# Patient Record
Sex: Male | Born: 1965 | Race: Black or African American | Hispanic: No | Marital: Married | Smoking: Smoker, current status unknown
Health system: Southern US, Community
[De-identification: ages and names within clinical notes are randomized; demographics above are authoritative.]

## PROBLEM LIST (undated history)

## (undated) HISTORY — PX: OTHER SURGICAL HISTORY: SHX169

---

## 2006-11-19 ENCOUNTER — Emergency Department: Payer: Self-pay | Admitting: Emergency Medicine

## 2009-10-12 ENCOUNTER — Emergency Department: Payer: Self-pay | Admitting: Unknown Physician Specialty

## 2010-10-17 ENCOUNTER — Emergency Department: Payer: Self-pay | Admitting: Emergency Medicine

## 2010-10-20 ENCOUNTER — Emergency Department: Payer: Self-pay | Admitting: Emergency Medicine

## 2010-11-08 ENCOUNTER — Inpatient Hospital Stay: Payer: Self-pay | Admitting: Internal Medicine

## 2018-05-03 ENCOUNTER — Other Ambulatory Visit: Payer: Self-pay

## 2018-05-03 ENCOUNTER — Emergency Department (HOSPITAL_COMMUNITY)
Admission: EM | Admit: 2018-05-03 | Discharge: 2018-05-03 | Disposition: A | Discharge status: Home / Self Care | Payer: No Typology Code available for payment source | Attending: Emergency Medicine | Admitting: Emergency Medicine

## 2018-05-03 ENCOUNTER — Emergency Department (HOSPITAL_COMMUNITY): Payer: No Typology Code available for payment source

## 2018-05-03 ENCOUNTER — Encounter (HOSPITAL_COMMUNITY): Payer: Self-pay | Admitting: Emergency Medicine

## 2018-05-03 DIAGNOSIS — Z91010 Allergy to peanuts: Secondary | ICD-10-CM | POA: Insufficient documentation

## 2018-05-03 DIAGNOSIS — Y92410 Unspecified street and highway as the place of occurrence of the external cause: Secondary | ICD-10-CM | POA: Insufficient documentation

## 2018-05-03 DIAGNOSIS — Y998 Other external cause status: Secondary | ICD-10-CM | POA: Insufficient documentation

## 2018-05-03 DIAGNOSIS — S39012A Strain of muscle, fascia and tendon of lower back, initial encounter: Secondary | ICD-10-CM | POA: Insufficient documentation

## 2018-05-03 DIAGNOSIS — Y9389 Activity, other specified: Secondary | ICD-10-CM | POA: Diagnosis not present

## 2018-05-03 DIAGNOSIS — S161XXA Strain of muscle, fascia and tendon at neck level, initial encounter: Secondary | ICD-10-CM

## 2018-05-03 DIAGNOSIS — S3992XA Unspecified injury of lower back, initial encounter: Secondary | ICD-10-CM | POA: Diagnosis present

## 2018-05-03 MED ORDER — IBUPROFEN 800 MG PO TABS
800.0000 mg | ORAL_TABLET | Freq: Once | ORAL | Status: AC
  Administered 2018-05-03: 800 mg via ORAL
  Filled 2018-05-03: qty 1

## 2018-05-03 MED ORDER — CYCLOBENZAPRINE HCL 10 MG PO TABS
10.0000 mg | ORAL_TABLET | Freq: Once | ORAL | Status: AC
  Administered 2018-05-03: 10 mg via ORAL
  Filled 2018-05-03: qty 1

## 2018-05-03 MED ORDER — CYCLOBENZAPRINE HCL 10 MG PO TABS: 10.0000 mg | ORAL_TABLET | Freq: Three times a day (TID) | ORAL | 0 refills | Status: DC | PRN

## 2018-05-03 MED ORDER — IBUPROFEN 600 MG PO TABS: 600.0000 mg | ORAL_TABLET | Freq: Four times a day (QID) | ORAL | 0 refills | Status: DC | PRN
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
## 2018-05-03 NOTE — Discharge Instructions (Addendum)
Apply ice packs on and off to your neck.  Take the medication as directed if needed for pain or muscle spasms.  Follow-up with your primary provider for recheck in a few days if your symptoms are not improving.  Return the ER for any worsening symptoms.

## 2018-06-21 ENCOUNTER — Encounter (HOSPITAL_COMMUNITY): Payer: Self-pay | Admitting: Emergency Medicine

## 2018-06-21 ENCOUNTER — Emergency Department (HOSPITAL_COMMUNITY)
Admission: EM | Admit: 2018-06-21 | Discharge: 2018-06-21 | Disposition: A | Discharge status: Home / Self Care | Payer: Commercial Managed Care - PPO | Attending: Emergency Medicine | Admitting: Emergency Medicine

## 2018-06-21 ENCOUNTER — Emergency Department (HOSPITAL_COMMUNITY): Payer: Commercial Managed Care - PPO

## 2018-06-21 ENCOUNTER — Other Ambulatory Visit: Payer: Self-pay

## 2018-06-21 DIAGNOSIS — S161XXA Strain of muscle, fascia and tendon at neck level, initial encounter: Secondary | ICD-10-CM | POA: Diagnosis not present

## 2018-06-21 DIAGNOSIS — Y92410 Unspecified street and highway as the place of occurrence of the external cause: Secondary | ICD-10-CM | POA: Diagnosis not present

## 2018-06-21 DIAGNOSIS — Y999 Unspecified external cause status: Secondary | ICD-10-CM | POA: Diagnosis not present

## 2018-06-21 DIAGNOSIS — Y9389 Activity, other specified: Secondary | ICD-10-CM | POA: Diagnosis not present

## 2018-06-21 DIAGNOSIS — S199XXA Unspecified injury of neck, initial encounter: Secondary | ICD-10-CM | POA: Diagnosis present

## 2018-06-21 MED ORDER — CYCLOBENZAPRINE HCL 5 MG PO TABS: 5.0000 mg | ORAL_TABLET | Freq: Three times a day (TID) | ORAL | 0 refills | Status: DC | PRN

## 2018-06-21 MED ORDER — IBUPROFEN 600 MG PO TABS: 600.0000 mg | ORAL_TABLET | Freq: Four times a day (QID) | ORAL | 0 refills | Status: DC | PRN
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
## 2018-12-17 ENCOUNTER — Emergency Department (HOSPITAL_COMMUNITY)
Admission: EM | Admit: 2018-12-17 | Discharge: 2018-12-17 | Disposition: A | Discharge status: Home / Self Care | Payer: Commercial Managed Care - PPO | Attending: Emergency Medicine | Admitting: Emergency Medicine

## 2018-12-17 ENCOUNTER — Other Ambulatory Visit: Payer: Self-pay

## 2018-12-17 ENCOUNTER — Encounter (HOSPITAL_COMMUNITY): Payer: Self-pay

## 2018-12-17 DIAGNOSIS — Y929 Unspecified place or not applicable: Secondary | ICD-10-CM | POA: Insufficient documentation

## 2018-12-17 DIAGNOSIS — N4822 Cellulitis of corpus cavernosum and penis: Secondary | ICD-10-CM

## 2018-12-17 DIAGNOSIS — Y9389 Activity, other specified: Secondary | ICD-10-CM | POA: Insufficient documentation

## 2018-12-17 DIAGNOSIS — W57XXXA Bitten or stung by nonvenomous insect and other nonvenomous arthropods, initial encounter: Secondary | ICD-10-CM | POA: Insufficient documentation

## 2018-12-17 DIAGNOSIS — Z91010 Allergy to peanuts: Secondary | ICD-10-CM | POA: Insufficient documentation

## 2018-12-17 DIAGNOSIS — Y999 Unspecified external cause status: Secondary | ICD-10-CM | POA: Insufficient documentation

## 2018-12-17 LAB — CBC WITH DIFFERENTIAL/PLATELET
Abs Immature Granulocytes: 0.02 10*3/uL (ref 0.00–0.07)
Basophils Absolute: 0 10*3/uL (ref 0.0–0.1)
Basophils Relative: 0 %
Eosinophils Absolute: 0.1 10*3/uL (ref 0.0–0.5)
Eosinophils Relative: 2 %
HCT: 41.3 % (ref 39.0–52.0)
Hemoglobin: 13.5 g/dL (ref 13.0–17.0)
Immature Granulocytes: 0 %
Lymphocytes Relative: 26 %
Lymphs Abs: 1.6 10*3/uL (ref 0.7–4.0)
MCH: 29 pg (ref 26.0–34.0)
MCHC: 32.7 g/dL (ref 30.0–36.0)
MCV: 88.6 fL (ref 80.0–100.0)
Monocytes Absolute: 0.4 10*3/uL (ref 0.1–1.0)
Monocytes Relative: 6 %
Neutro Abs: 3.8 10*3/uL (ref 1.7–7.7)
Neutrophils Relative %: 66 %
Platelets: 236 10*3/uL (ref 150–400)
RBC: 4.66 MIL/uL (ref 4.22–5.81)
RDW: 14.4 % (ref 11.5–15.5)
WBC: 5.9 10*3/uL (ref 4.0–10.5)
nRBC: 0 % (ref 0.0–0.2)

## 2018-12-17 LAB — BASIC METABOLIC PANEL
Anion gap: 5 (ref 5–15)
BUN: 18 mg/dL (ref 6–20)
CO2: 26 mmol/L (ref 22–32)
Calcium: 8.7 mg/dL — ABNORMAL LOW (ref 8.9–10.3)
Chloride: 111 mmol/L (ref 98–111)
Creatinine, Ser: 1.34 mg/dL — ABNORMAL HIGH (ref 0.61–1.24)
GFR calc Af Amer: >60 mL/min (ref >60)
GFR calc non Af Amer: >60 mL/min (ref >60)
Glucose, Bld: 96 mg/dL (ref 70–99)
Potassium: 4.2 mmol/L (ref 3.5–5.1)
Sodium: 142 mmol/L (ref 135–145)

## 2018-12-17 MED ORDER — DOXYCYCLINE HYCLATE 100 MG PO CAPS: 100.0000 mg | ORAL_CAPSULE | Freq: Two times a day (BID) | ORAL | 0 refills | Status: DC

## 2018-12-17 MED ORDER — HYDROCODONE-ACETAMINOPHEN 5-325 MG PO TABS: 2.0000 | ORAL_TABLET | ORAL | 0 refills | Status: DC | PRN

## 2018-12-17 MED ORDER — DOXYCYCLINE HYCLATE 100 MG PO TABS
100.0000 mg | ORAL_TABLET | Freq: Once | ORAL | Status: AC
  Administered 2018-12-17: 100 mg via ORAL
  Filled 2018-12-17: qty 1
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
## 2018-12-17 NOTE — Discharge Instructions (Signed)
Discharging you with antibiotics and pain medication.  Please return to the emergency department immediately if you developed chills, fever, worsening or severe pain, inability to urinate.  Otherwise please follow-up in the next 3 to 5 days with a urologist for reevaluation.

## 2018-12-17 NOTE — ED Triage Notes (Signed)
Pt reports pulling tick off of penis on Saturday. Reports swelling and itching

## 2018-12-17 NOTE — ED Provider Notes (Signed)
Patient placed in Quick Look pathway, seen and evaluated   Chief Complaint: Tick bite  HPI:   Patient bitten by a tick on Saturday on his penis He pulled the tick off and thought he removed  The entire tick out he has had increased pain and swelling now markedly larger and more tender. No hx of dm. No testicle pain.    ROS: Penis swellin (one)  Physical Exam:   Gen: No distress- appears uncomfortable  Neuro: Awake and Alert  Skin: Warm    Focused Exam: Markedly enlarged penis with generalized edema and swelling of the Foreskin extends over the glans- uncircumcised.   Initiation of care has begun. The patient has been counseled on the process, plan, and necessity for staying for the completion/evaluation, and the remainder of the medical screening examination    Margarita Mail, Hershal Coria 12/17/18 1714    Noemi Chapel, MD 12/22/18 [phone]

## 2018-12-18 MED FILL — Hydrocodone-Acetaminophen Tab 5-325 MG: ORAL | Qty: 6 | Status: AC

## 2019-03-16 ENCOUNTER — Emergency Department (HOSPITAL_COMMUNITY)
Admission: EM | Admit: 2019-03-16 | Discharge: 2019-03-16 | Disposition: A | Discharge status: Home / Self Care | Payer: Self-pay | Attending: Emergency Medicine | Admitting: Emergency Medicine

## 2019-03-16 ENCOUNTER — Encounter (HOSPITAL_COMMUNITY): Payer: Self-pay

## 2019-03-16 ENCOUNTER — Other Ambulatory Visit: Payer: Self-pay

## 2019-03-16 DIAGNOSIS — T7840XA Allergy, unspecified, initial encounter: Secondary | ICD-10-CM | POA: Insufficient documentation

## 2019-03-16 DIAGNOSIS — Z91010 Allergy to peanuts: Secondary | ICD-10-CM | POA: Insufficient documentation

## 2019-03-16 DIAGNOSIS — L509 Urticaria, unspecified: Secondary | ICD-10-CM

## 2019-03-16 DIAGNOSIS — F17200 Nicotine dependence, unspecified, uncomplicated: Secondary | ICD-10-CM | POA: Insufficient documentation

## 2019-03-16 MED ORDER — FAMOTIDINE IN NACL 20-0.9 MG/50ML-% IV SOLN
20.0000 mg | Freq: Once | INTRAVENOUS | Status: AC
  Administered 2019-03-16: 20 mg via INTRAVENOUS
  Filled 2019-03-16: qty 50

## 2019-03-16 MED ORDER — FAMOTIDINE 20 MG PO TABS: 20.0000 mg | ORAL_TABLET | Freq: Two times a day (BID) | ORAL | 0 refills | Status: DC

## 2019-03-16 MED ORDER — PREDNISONE 20 MG PO TABS: ORAL_TABLET | ORAL | 0 refills | Status: DC

## 2019-03-16 MED ORDER — DIPHENHYDRAMINE HCL 50 MG/ML IJ SOLN
25.0000 mg | Freq: Once | INTRAMUSCULAR | Status: AC
  Administered 2019-03-16: 25 mg via INTRAVENOUS
  Filled 2019-03-16: qty 1

## 2019-03-16 MED ORDER — METHYLPREDNISOLONE SODIUM SUCC 125 MG IJ SOLR
125.0000 mg | Freq: Once | INTRAMUSCULAR | Status: AC
  Administered 2019-03-16: 125 mg via INTRAVENOUS
  Filled 2019-03-16: qty 2

## 2019-03-16 NOTE — Discharge Instructions (Addendum)
Stay cool, getting hot such as working outside in the heat or taking a hot shower may make you start to itch or get the rash again.

## 2019-03-16 NOTE — ED Triage Notes (Signed)
Pt with hives and itching all over onset approx 1 hour ago, has taken one benadryl 10 mins ago.  Pt denies new meds or foods.

## 2019-03-16 NOTE — ED Provider Notes (Signed)
[facility] EMERGENCY DEPARTMENT Provider Note   CSN: [id number] Arrival date & time: 03/16/19  0202   Time seen 2:49 AM  History   Chief Complaint Chief Complaint  Patient presents with  . Allergic Reaction    HPI Francis Bennett is a 53 y.o. male.     HPI patient states about 10 PM his wife brought him a hamburger from [employer] that he is never eaten before.  He states about 1 AM he started having itching all over and then broke out in a rash.  He denies any swelling of his lips, tongue, throat, or having any difficulty breathing.  He states he is never had anything like this happen before.  He denies being on any new medications.  Nothing else was different today.  Patient took 25 mg of Benadryl prior to coming to the ED.  PCP Ranae Plumber, PA   History reviewed. No pertinent past medical history.  There are no active problems to display for this patient.   Past Surgical History:  Procedure Laterality Date  . skull surgery          Home Medications    Prior to Admission medications   Medication Sig Start Date End Date Taking? Authorizing Provider  cyclobenzaprine (FLEXERIL) 5 MG tablet Take 1 tablet (5 mg total) by mouth 3 (three) times daily as needed for muscle spasms. 06/21/18   Evalee Jefferson, PA-C  doxycycline (VIBRAMYCIN) 100 MG capsule Take 1 capsule (100 mg total) by mouth 2 (two) times daily. 12/17/18   Harris, Vernie Shanks, PA-C  famotidine (PEPCID) 20 MG tablet Take 1 tablet (20 mg total) by mouth 2 (two) times daily. 03/16/19   Rolland Porter, MD  HYDROcodone-acetaminophen (NORCO/VICODIN) 5-325 MG tablet Take 2 tablets by mouth every 4 (four) hours as needed for moderate pain. 12/17/18   Noemi Chapel, MD  ibuprofen (ADVIL,MOTRIN) 600 MG tablet Take 1 tablet (600 mg total) by mouth every 6 (six) hours as needed. 06/21/18   Evalee Jefferson, PA-C  predniSONE (DELTASONE) 20 MG tablet Take 3 po QD x 3d , then 2 po QD x 3d then 1 po QD x 3d 03/16/19   Rolland Porter, MD    Family  History History reviewed. No pertinent family history.  Social History Social History   Tobacco Use  . Smoking status: Smoker, Current Status Unknown  . Smokeless tobacco: Never Used  Substance Use Topics  . Alcohol use: Yes    Comment: occasionally  . Drug use: Not Currently  lives at home Lives with spouse   Allergies   Peanut-containing drug products   Review of Systems Review of Systems  All other systems reviewed and are negative.    Physical Exam Updated Vital Signs BP 124/79   Pulse 91   Temp 98.3 F (36.8 C) (Oral)   Resp 20   Ht 5\' 7"  (1.702 m)   Wt 102.1 kg   SpO2 95%   BMI 35.24 kg/m   Physical Exam Vitals signs and nursing note reviewed.  Constitutional:      General: He is not in acute distress.    Appearance: Normal appearance. He is well-developed. He is not ill-appearing or toxic-appearing.  HENT:     Head: Normocephalic and atraumatic.     Right Ear: External ear normal.     Left Ear: External ear normal.     Nose: Nose normal. No mucosal edema or rhinorrhea.     Mouth/Throat:     Mouth: Mucous membranes are  dry.     Dentition: No dental abscesses.     Pharynx: Oropharynx is clear. No uvula swelling.     Comments: No swelling of lips, tongue, or oropharynx seen Eyes:     Extraocular Movements: Extraocular movements intact.     Conjunctiva/sclera: Conjunctivae normal.     Pupils: Pupils are equal, round, and reactive to light.  Neck:     Musculoskeletal: Full passive range of motion without pain, normal range of motion and neck supple.  Cardiovascular:     Rate and Rhythm: Normal rate and regular rhythm.     Heart sounds: Normal heart sounds. No murmur. No friction rub. No gallop.   Pulmonary:     Effort: Pulmonary effort is normal. No respiratory distress.     Breath sounds: Normal breath sounds. No wheezing, rhonchi or rales.  Chest:     Chest wall: No tenderness or crepitus.  Musculoskeletal: Normal range of motion.         General: No tenderness.     Comments: Moves all extremities well.   Skin:    General: Skin is warm and dry.     Coloration: Skin is not pale.     Findings: Rash present. No erythema.     Comments: Patient has diffuse small urticarial lesions some coalescing over most of his trunk and extremities.  Neurological:     Mental Status: He is alert and oriented to person, place, and time.     Cranial Nerves: No cranial nerve deficit.  Psychiatric:        Mood and Affect: Mood is not anxious.        Speech: Speech normal.        Behavior: Behavior normal.      ED Treatments / Results  Labs (all labs ordered are listed, but only abnormal results are displayed) Labs Reviewed - No data to display  EKG None  Radiology No results found.  Procedures Procedures (including critical care time)  Medications Ordered in ED Medications  diphenhydrAMINE (BENADRYL) injection 25 mg (25 mg Intravenous Given 03/16/19 0232)  famotidine (PEPCID) IVPB 20 mg premix (0 mg Intravenous Stopped 03/16/19 0326)  methylPREDNISolone sodium succinate (SOLU-MEDROL) 125 mg/2 mL injection 125 mg (125 mg Intravenous Given 03/16/19 0232)     Initial Impression / Assessment and Plan / ED Course  I have reviewed the triage vital signs and the nursing notes.  Pertinent labs & imaging results that were available during my care of the patient were reviewed by me and considered in my medical decision making (see chart for details).      Patient had already taken 25 mg of Benadryl at home, he was given Benadryl 25 mg IV, Solu-Medrol 125 mg IV and Pepcid 20 mg IV.  Recheck at 5 AM patient is easily awakened, his rash is gone, he states his itching is gone.  His wife is here and is aware of what type of sandwich she had bought him which was something new at [employer].  She also states he ate a cupcake from a birthday party today.  He was advised to avoid both of these things in the future and he is agreeable.  Final  Clinical Impressions(s) / ED Diagnoses   Final diagnoses:  Allergic reaction, initial encounter  Urticaria    ED Discharge Orders         Ordered    famotidine (PEPCID) 20 MG tablet  2 times daily     03/16/19 0530  predniSONE (DELTASONE) 20 MG tablet     03/16/19 0530          Plan discharge  Devoria AlbeIva Mesiah Manzo, MD, Concha PyoFACEP    Lillieanna Tuohy, MD 03/16/19 [phone]

## 2019-08-13 ENCOUNTER — Ambulatory Visit: Payer: Self-pay | Attending: Internal Medicine

## 2019-08-13 ENCOUNTER — Other Ambulatory Visit: Payer: Self-pay

## 2019-08-13 DIAGNOSIS — Z20822 Contact with and (suspected) exposure to covid-19: Secondary | ICD-10-CM | POA: Insufficient documentation

## 2019-08-14 LAB — NOVEL CORONAVIRUS, NAA: SARS-CoV-2, NAA: NOT DETECTED

## 2019-08-16 ENCOUNTER — Telehealth: Payer: Self-pay

## 2019-08-16 NOTE — Telephone Encounter (Signed)
Pt. Given COVID 19 results, verbalizes understanding. 

## 2019-09-26 ENCOUNTER — Ambulatory Visit: Payer: Self-pay | Attending: Internal Medicine

## 2019-09-26 DIAGNOSIS — Z23 Encounter for immunization: Secondary | ICD-10-CM

## 2019-09-26 NOTE — Progress Notes (Signed)
   Covid-19 Vaccination Clinic  Name:  MARICUS TANZI    MRN: [id number] DOB: 11-Sep-1965  09/26/2019  Mr. Barsky was observed post Covid-19 immunization for 30 minutes based on pre-vaccination screening without incident. He was provided with Vaccine Information Sheet and instruction to access the V-Safe system.   Mr. Kluever was instructed to call 911 with any severe reactions post vaccine: Marland Kitchen Difficulty breathing  . Swelling of face and throat  . A fast heartbeat  . A bad rash all over body  . Dizziness and weakness   Immunizations Administered    Name Date Dose VIS Date Route   Moderna COVID-19 Vaccine 09/26/2019  1:41 PM 0.5 mL 06/04/2019 Intramuscular   Manufacturer: Moderna   Lot: [id number]   NDC: 71580-638-68

## 2019-10-24 ENCOUNTER — Ambulatory Visit: Payer: Self-pay | Attending: Internal Medicine

## 2019-10-24 DIAGNOSIS — Z23 Encounter for immunization: Secondary | ICD-10-CM

## 2019-10-24 NOTE — Progress Notes (Signed)
   Covid-19 Vaccination Clinic  Name:  CODI KERTZ    MRN: [id number] DOB: 1965/08/03  10/24/2019  Mr. Matty was observed post Covid-19 immunization for 15 minutes without incident. He was provided with Vaccine Information Sheet and instruction to access the V-Safe system.   Mr. Senn was instructed to call 911 with any severe reactions post vaccine: Marland Kitchen Difficulty breathing  . Swelling of face and throat  . A fast heartbeat  . A bad rash all over body  . Dizziness and weakness   Immunizations Administered    Name Date Dose VIS Date Route   Moderna COVID-19 Vaccine 10/24/2019  1:47 PM 0.5 mL 06/2019 Intramuscular   Manufacturer: Moderna   Lot: [id number]   NDC: 92780-044-71
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
## 2024-07-09 ENCOUNTER — Other Ambulatory Visit: Payer: Self-pay

## 2024-07-09 ENCOUNTER — Encounter (HOSPITAL_COMMUNITY): Payer: Self-pay

## 2024-07-09 ENCOUNTER — Emergency Department (HOSPITAL_COMMUNITY)
Admission: EM | Admit: 2024-07-09 | Discharge: 2024-07-10 | Disposition: A | Discharge status: Home / Self Care | Payer: MEDICAID | Attending: Emergency Medicine | Admitting: Emergency Medicine

## 2024-07-09 DIAGNOSIS — Z91010 Allergy to peanuts: Secondary | ICD-10-CM | POA: Diagnosis not present

## 2024-07-09 DIAGNOSIS — R059 Cough, unspecified: Secondary | ICD-10-CM | POA: Diagnosis present

## 2024-07-09 DIAGNOSIS — J40 Bronchitis, not specified as acute or chronic: Secondary | ICD-10-CM | POA: Insufficient documentation

## 2024-07-09 NOTE — ED Triage Notes (Signed)
 Pt reports dry cough x 1-2 weeks, chest pain started last night, pt also reports runny nose, watery eyes and being tired, not able to sleep

## 2024-07-10 ENCOUNTER — Emergency Department (HOSPITAL_COMMUNITY): Payer: MEDICAID

## 2024-07-10 LAB — BASIC METABOLIC PANEL WITH GFR
Anion gap: 8 (ref 5–15)
BUN: 18 mg/dL (ref 6–20)
CO2: 27 mmol/L (ref 22–32)
Calcium: 8.2 mg/dL — ABNORMAL LOW (ref 8.9–10.3)
Chloride: 105 mmol/L (ref 98–111)
Creatinine, Ser: 1.07 mg/dL (ref 0.61–1.24)
GFR, Estimated: >60 mL/min
Glucose, Bld: 89 mg/dL (ref 70–99)
Potassium: 4.1 mmol/L (ref 3.5–5.1)
Sodium: 140 mmol/L (ref 135–145)

## 2024-07-10 LAB — CBC WITH DIFFERENTIAL/PLATELET
Abs Immature Granulocytes: 0.01 K/uL (ref 0.00–0.07)
Basophils Absolute: 0 K/uL (ref 0.0–0.1)
Basophils Relative: 0 %
Eosinophils Absolute: 0 K/uL (ref 0.0–0.5)
Eosinophils Relative: 1 %
HCT: 35.9 % — ABNORMAL LOW (ref 39.0–52.0)
Hemoglobin: 11.7 g/dL — ABNORMAL LOW (ref 13.0–17.0)
Immature Granulocytes: 0 %
Lymphocytes Relative: 19 %
Lymphs Abs: 0.7 K/uL (ref 0.7–4.0)
MCH: 29.3 pg (ref 26.0–34.0)
MCHC: 32.6 g/dL (ref 30.0–36.0)
MCV: 90 fL (ref 80.0–100.0)
Monocytes Absolute: 0.2 K/uL (ref 0.1–1.0)
Monocytes Relative: 6 %
Neutro Abs: 2.6 K/uL (ref 1.7–7.7)
Neutrophils Relative %: 74 %
Platelets: 211 K/uL (ref 150–400)
RBC: 3.99 MIL/uL — ABNORMAL LOW (ref 4.22–5.81)
RDW: 14.9 % (ref 11.5–15.5)
WBC: 3.5 K/uL — ABNORMAL LOW (ref 4.0–10.5)
nRBC: 0 % (ref 0.0–0.2)

## 2024-07-10 LAB — TROPONIN T, HIGH SENSITIVITY: Troponin T High Sensitivity: <15 ng/L (ref 0–19)

## 2024-07-10 MED ORDER — PREDNISONE 50 MG PO TABS
60.0000 mg | ORAL_TABLET | Freq: Once | ORAL | Status: AC
  Administered 2024-07-10: 60 mg via ORAL
  Filled 2024-07-10: qty 1

## 2024-07-10 MED ORDER — PREDNISONE 10 MG (21) PO TBPK: ORAL_TABLET | ORAL | 0 refills | Status: DC

## 2024-07-10 MED ORDER — ALBUTEROL SULFATE HFA 108 (90 BASE) MCG/ACT IN AERS
2.0000 | INHALATION_SPRAY | RESPIRATORY_TRACT | Status: DC | PRN
  Administered 2024-07-10: 2 via RESPIRATORY_TRACT
  Filled 2024-07-10: qty 6.7

## 2024-07-10 NOTE — ED Notes (Signed)
 RT called to give MDI with instructions on use.

## 2024-07-10 NOTE — ED Provider Notes (Signed)
 "  [place] EMERGENCY DEPARTMENT AT [facility]  Provider Note  CSN: [id number] Arrival date & time: 07/09/24 2329  History Chief Complaint  Patient presents with   Cough   Chest Pain    Francis Bennett is a 59 y.o. male with no significant PMH reports he has had a dry cough for the last week or two, worsened in the last couple days, particularly at night. No fevers. He does smoke occasionally. The coughing has made his chest sore.    Home Medications Prior to Admission medications  Medication Sig Start Date End Date Taking? Authorizing Provider  predniSONE  (STERAPRED UNI-PAK 21 TAB) 10 MG (21) TBPK tablet 10mg  Tabs, 6 day taper. Use as directed 07/10/24  Yes Roselyn Carlin NOVAK, MD     Allergies    Peanut-containing drug products   Review of Systems   Review of Systems Please see HPI for pertinent positives and negatives  Physical Exam BP (!) 145/85   Pulse 88   Temp 98.9 F (37.2 C) (Oral)   Resp 20   Ht 5' 8 (1.727 m)   Wt 89.8 kg   SpO2 99%   BMI 30.11 kg/m   Physical Exam Vitals and nursing note reviewed.  Constitutional:      Appearance: Normal appearance.  HENT:     Head: Normocephalic and atraumatic.     Nose: Nose normal.     Mouth/Throat:     Mouth: Mucous membranes are moist.  Eyes:     Extraocular Movements: Extraocular movements intact.     Conjunctiva/sclera: Conjunctivae normal.  Cardiovascular:     Rate and Rhythm: Normal rate.  Pulmonary:     Effort: Pulmonary effort is normal.     Breath sounds: Normal breath sounds.     Comments: Cry cough Abdominal:     General: Abdomen is flat.     Palpations: Abdomen is soft.     Tenderness: There is no abdominal tenderness.  Musculoskeletal:        General: No swelling. Normal range of motion.     Cervical back: Neck supple.  Skin:    General: Skin is warm and dry.  Neurological:     General: No focal deficit present.     Mental Status: He is alert.  Psychiatric:        Mood and  Affect: Mood normal.     ED Results / Procedures / Treatments   EKG EKG Interpretation Date/Time:  Tuesday July 09 2024 23:44:14 EST Ventricular Rate:  84 PR Interval:  152 QRS Duration:  96 QT Interval:  358 QTC Calculation: 424 R Axis:   -59  Text Interpretation: Sinus rhythm Left anterior fascicular block Abnormal R-wave progression, late transition Baseline wander in lead(s) II No old tracing to compare Confirmed by Roselyn Carlin [phone]) on 07/10/2024 2:26:45 AM  Procedures Procedures  Medications Ordered in the ED Medications  albuterol  (VENTOLIN  HFA) 108 (90 Base) MCG/ACT inhaler 2 puff (has no administration in time range)  predniSONE  (DELTASONE ) tablet 60 mg (has no administration in time range)    Initial Impression and Plan  Patient here with dry cough, some chest soreness but no anginal symptoms. He had labs done in triage showing unremarkable CBC, BMP and Trop. I personally viewed the images from radiology studies and agree with radiologist interpretation: CXR is clear. Suspect a viral bronchitis with post-viral cough. Will give inhaler, course of steroids. Recommend tobacco cessation, PCP follow up, RTED for any other concerns.  ED Course       MDM Rules/Calculators/A&P Medical Decision Making Problems Addressed: Bronchitis: acute illness or injury  Amount and/or Complexity of Data Reviewed Labs: ordered. Decision-making details documented in ED Course. Radiology: ordered and independent interpretation performed. Decision-making details documented in ED Course. ECG/medicine tests: ordered and independent interpretation performed. Decision-making details documented in ED Course.  Risk Prescription drug management.     Final Clinical Impression(s) / ED Diagnoses Final diagnoses:  Bronchitis    Rx / DC Orders ED Discharge Orders          Ordered    predniSONE  (STERAPRED UNI-PAK 21 TAB) 10 MG (21) TBPK tablet        07/10/24 0237              Roselyn Carlin NOVAK, MD 07/10/24 [phone]  "

## 2024-07-17 ENCOUNTER — Emergency Department (HOSPITAL_COMMUNITY)
Admission: EM | Admit: 2024-07-17 | Discharge: 2024-07-17 | Disposition: A | Discharge status: Home / Self Care | Payer: MEDICAID | Attending: Emergency Medicine | Admitting: Emergency Medicine

## 2024-07-17 ENCOUNTER — Other Ambulatory Visit: Payer: Self-pay

## 2024-07-17 ENCOUNTER — Emergency Department (HOSPITAL_COMMUNITY): Payer: MEDICAID

## 2024-07-17 ENCOUNTER — Encounter (HOSPITAL_COMMUNITY): Payer: Self-pay

## 2024-07-17 DIAGNOSIS — M160 Bilateral primary osteoarthritis of hip: Secondary | ICD-10-CM | POA: Diagnosis not present

## 2024-07-17 DIAGNOSIS — M25552 Pain in left hip: Secondary | ICD-10-CM | POA: Diagnosis present

## 2024-07-17 MED ORDER — MELOXICAM 15 MG PO TABS: 15.0000 mg | ORAL_TABLET | Freq: Every day | ORAL | 0 refills | Status: DC

## 2024-07-17 MED ORDER — HYDROCODONE-ACETAMINOPHEN 5-325 MG PO TABS
1.0000 | ORAL_TABLET | Freq: Once | ORAL | Status: AC
  Administered 2024-07-17: 1 via ORAL
  Filled 2024-07-17: qty 1

## 2024-07-17 MED ORDER — NAPROXEN 250 MG PO TABS
500.0000 mg | ORAL_TABLET | Freq: Once | ORAL | Status: AC
  Administered 2024-07-17: 500 mg via ORAL
  Filled 2024-07-17: qty 2

## 2024-07-17 NOTE — ED Triage Notes (Signed)
 Pt arrived via [facility] c/o bilateral hip pain that has been on-going for a long time. Pt ambulatory in Triage. Pt reports speaking to a doctor in [facility] in the past and being advised he would need bilateral hip replacement eventually.

## 2024-07-17 NOTE — ED Provider Notes (Signed)
 " [place] EMERGENCY DEPARTMENT AT [facility] Provider Note   CSN: [id number] Arrival date & time: 07/17/24  1125     Patient presents with: Hip Pain   Francis Bennett is a 59 y.o. male.   Patient is a 59 year old male who presents emergency department the chief complaint of bilateral hip pain which has been ongoing for years.  Patient notes that it has become worse over the past few days.  He notes that he has previously seen an orthopedic provider who stated that he may need hip replacements.  He notes that he has had no recent falls or blunt trauma.  He has had no numbness or paresthesias.  He denies any swelling or redness.  There has been no associated abdominal pain, nausea, vomiting, diarrhea.   Hip Pain       Prior to Admission medications  Medication Sig Start Date End Date Taking? Authorizing Provider  predniSONE  (STERAPRED UNI-PAK 21 TAB) 10 MG (21) TBPK tablet 10mg  Tabs, 6 day taper. Use as directed 07/10/24   Roselyn Carlin NOVAK, MD    Allergies: Peanut-containing drug products    Review of Systems  Musculoskeletal:        Bilateral hip pain  All other systems reviewed and are negative.   Updated Vital Signs BP (!) 163/82 (BP Location: Right Arm)   Pulse (!) 59   Temp 98.4 F (36.9 C) (Oral)   Resp 18   Ht 5' 8 (1.727 m)   Wt 89.8 kg   SpO2 100%   BMI 30.11 kg/m   Physical Exam Vitals and nursing note reviewed.  Constitutional:      General: He is not in acute distress.    Appearance: Normal appearance. He is not ill-appearing.  HENT:     Head: Normocephalic and atraumatic.     Nose: Nose normal.     Mouth/Throat:     Mouth: Mucous membranes are moist.  Eyes:     Extraocular Movements: Extraocular movements intact.     Conjunctiva/sclera: Conjunctivae normal.     Pupils: Pupils are equal, round, and reactive to light.  Cardiovascular:     Rate and Rhythm: Normal rate and regular rhythm.     Pulses: Normal pulses.     Heart sounds:  Normal heart sounds. No murmur heard.    No gallop.  Pulmonary:     Effort: Pulmonary effort is normal. No respiratory distress.     Breath sounds: Normal breath sounds. No stridor. No wheezing, rhonchi or rales.  Abdominal:     General: Abdomen is flat. Bowel sounds are normal. There is no distension.     Palpations: Abdomen is soft.     Tenderness: There is no abdominal tenderness. There is no guarding.  Musculoskeletal:        General: Normal range of motion.     Cervical back: Normal range of motion and neck supple.     Comments: Mild tenderness palpation of her bilateral hips, pelvis stable to pelvic compression, nontender palpation of remainder bilateral lower extremities, peripheral pulses 2+, sensation intact distally, gait stable without assistance, full range of motion noted throughout, no obvious deformity or bruising, no skin breakdown or ulceration, no lacerations or abrasions  Skin:    General: Skin is warm and dry.     Findings: No bruising or rash.  Neurological:     General: No focal deficit present.     Mental Status: He is alert and oriented to person, place, and  time. Mental status is at baseline.  Psychiatric:        Mood and Affect: Mood normal.        Behavior: Behavior normal.        Thought Content: Thought content normal.        Judgment: Judgment normal.     (all labs ordered are listed, but only abnormal results are displayed) Labs Reviewed - No data to display  EKG: None  Radiology: No results found.   Procedures   Medications Ordered in the ED  naproxen  (NAPROSYN ) tablet 500 mg (has no administration in time range)  HYDROcodone -acetaminophen  (NORCO/VICODIN) 5-325 MG per tablet 1 tablet (has no administration in time range)                                    Medical Decision Making Patient is doing well at this time and is stable for discharge home.  Discussed with patient that we will treat him with NSAIDs at this time.  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  Do suspect that this is causing his pain.  Do not suspect underlying etiology such as septic joint or gout.  He has no indication for acute arterial insufficiency or DVT at this point.  Discussed with patient the need for close follow-up with orthopedics and resources were provided.  He was directed to call them for an appointment.  Strict turn precautions were provided for any new or worsening symptoms.  Patient voiced understanding and had no additional questions.  Amount and/or Complexity of Data Reviewed Radiology: ordered.  Risk Prescription drug management.        Final diagnoses:  None    ED Discharge Orders     None          Daralene Lonni JONETTA DEVONNA 07/17/24 1300    Melvenia Motto, MD 07/17/24 1502  "

## 2024-07-17 NOTE — Discharge Instructions (Signed)
 Please follow-up closely with orthopedics on an outpatient basis.  Please call to make an appointment with our office.  Return to emergency department immediately for any new or worsening symptoms.

## 2024-07-25 ENCOUNTER — Ambulatory Visit: Payer: MEDICAID | Admitting: Orthopedic Surgery

## 2024-07-25 ENCOUNTER — Encounter: Payer: Self-pay | Admitting: Orthopedic Surgery

## 2024-07-25 VITALS — BP 134/78 | HR 90 | Ht 68.0 in | Wt 198.0 lb

## 2024-07-25 DIAGNOSIS — M160 Bilateral primary osteoarthritis of hip: Secondary | ICD-10-CM

## 2024-07-25 NOTE — Progress Notes (Signed)
 "  Office Visit Note   Patient: Francis Bennett           Date of Birth: 04/11/1966           MRN: [id number] Visit Date: 07/25/2024 Requested by: Donnie Handing, PA [address] [place],  [place] [address] PCP: Donnie Handing, [place]   Assessment & Plan:   Encounter Diagnosis  Name Primary?   Arthritis of both hips Yes    No orders of the defined types were placed in this encounter.   59 year old male with end-stage arthritis in both hips right worse than left.  He is a good candidate for total hip arthroplasty with no medical problems  He is ready to proceed with hip replacement surgery.  I discussed the potential risks of surgery including but not limited to  Dislocation decreased with anterior approach  Blood clot requiring 6 weeks of anticoagulation  Continued pain  Possible need for back surgery  Leg length discrepancy   Subjective: Chief Complaint  Patient presents with   Hip Pain    Bilateral     HPI: Mr. Francis Bennett is 59 years old healthy takes no medications he has noticed progressively worsening pain in both hips right worse than left.  Over the years his deterioration has progressed slowly until recently.  Progression of dysfunction in his activities of daily living has worsened over the last few weeks.  He went to the emergency room.  They started meloxicam  which he does not want to take because he is worried about the side effects.  However he will take 1 when he is really having a lot of pain  He has tried to use a cane  He has bilateral groin pain he has a noticeable limp favoring his right side.                ROS: Bleakley negative   Images personally read and my interpretation : Outside imaging both hips and pelvis show degenerative arthritis in the lower lumbar spine  Bilateral end-stage hip arthritis right greater than left with secondary bone changes joint space narrowing osteophytes and sclerosis  Visit Diagnoses:  1. Arthritis of both hips       Follow-Up Instructions: Return for to see Dr Onesimo to discuss total hip replacements anterior (needs both) probably right first.    Objective: Vital Signs: BP 134/78   Pulse 90   Ht 5' 8 (1.727 m)   Wt 198 lb (89.8 kg)   BMI 30.11 kg/m   Physical Exam Vitals and nursing note reviewed.  Constitutional:      General: He is not in acute distress.    Appearance: Normal appearance. He is not ill-appearing, toxic-appearing or diaphoretic.  HENT:     Head: Normocephalic and atraumatic.     Nose: Nose normal. No congestion or rhinorrhea.  Eyes:     General: No scleral icterus.       Right eye: No discharge.        Left eye: No discharge.     Extraocular Movements: Extraocular movements intact.     Conjunctiva/sclera: Conjunctivae normal.     Pupils: Pupils are equal, round, and reactive to light.  Cardiovascular:     Pulses: Normal pulses.  Pulmonary:     Effort: Pulmonary effort is normal.     Breath sounds: No wheezing.  Skin:    General: Skin is warm and dry.     Capillary Refill: Capillary refill takes less than 2 seconds.  Coloration: Skin is not jaundiced.     Findings: No erythema.  Neurological:     General: No focal deficit present.     Mental Status: He is alert and oriented to person, place, and time.  Psychiatric:        Mood and Affect: Mood normal.        Behavior: Behavior normal.        Thought Content: Thought content normal.        Judgment: Judgment normal.      Right Hip Exam   Tenderness  The patient is experiencing no tenderness.   Other  Erythema: absent Scars: absent Sensation: normal Pulse: present  Comments:  Painful range of motion at 90 degrees hip flexion  Decreased internal rotation  Leg lengths equal   Left Hip Exam   Tenderness  The patient is experiencing no tenderness.   Other  Erythema: absent Scars: absent Sensation: normal Pulse: present  Comments:  Similar findings as the right hip with painful  range of motion up to 90 degrees of flexion.  He has decreased internal rotation  Leg lengths are equal       Specialty Comments:  No specialty comments available.  Imaging: No results found.   PMFS History: There are no active problems to display for this patient.  History reviewed. No pertinent past medical history.  History reviewed. No pertinent family history.  Past Surgical History:  Procedure Laterality Date   skull surgery     Social History   Occupational History   Not on file  Tobacco Use   Smoking status: Smoker, Current Status Unknown   Smokeless tobacco: Never  Vaping Use   Vaping status: Former  Substance and Sexual Activity   Alcohol use: Yes    Comment: occasionally   Drug use: Not Currently   Sexual activity: Not on file       "

## 2024-07-25 NOTE — Progress Notes (Signed)
" °  Intake history:  Chief Complaint  Patient presents with   Hip Pain    Bilateral      Ht 5' 8 (1.727 m)   Wt 198 lb (89.8 kg)   BMI 30.11 kg/m  Body mass index is 30.11 kg/m.  Pharmacy? __________[facility]____________  WHAT ARE WE SEEING YOU FOR TODAY?   Both hips  How long has this bothered you? (DOI?DOS?WS?)  Several years, getting worse, right hip pain is worse than left  Was there an injury? No  Anticoag.  No   Any ALLERGIES ________Allergies[1] ______________________________________   Treatment:  Have you taken:  Tylenol  No  Advil  No  Had PT No  Had injection No  Other  _______________Meloxicam__________        [1]  Allergies Allergen Reactions   Peanut-Containing Drug Products     Anaphylaxis    "

## 2024-07-25 NOTE — Patient Instructions (Addendum)
 To see Dr Onesimo discuss Anterior hip replacement

## 2024-07-31 ENCOUNTER — Ambulatory Visit: Payer: MEDICAID | Admitting: Orthopedic Surgery

## 2024-07-31 DIAGNOSIS — M160 Bilateral primary osteoarthritis of hip: Secondary | ICD-10-CM

## 2024-07-31 NOTE — Progress Notes (Signed)
 New Patient Visit  Summary: Francis Bennett is a 59 y.o. 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 Partial relief with meloxicam , but severe pain and disability persist. Candidate for right total hip arthroplasty using anterior approach for muscle-sparing benefits and lower dislocation risk. Surgical risks and anticipated outcomes discussed. - Continued meloxicam  for symptomatic relief.   Discussed Right total hip arthroplasty Candidate for anterior approach Right total hip arthroplasty due to severe osteoarthritis.  Risks include infection, blood loss, wound healing complications, limb length discrepancy, intraoperative fracture, venous thromboembolism, and anesthesia-related complications. Expected outcomes include improved pain and function.  - Discussed anterior approach, incision location, recovery, and possible bone grafting. - Reviewed perioperative risks. - Addressed postoperative pain management  - Planned postoperative anticoagulation with aspirin twice daily.  - Outlined early mobilization and weight-bearing with walker, and the possibility of home health physical therapy post-discharge. - Reviewed recovery timeline: overnight hospital admission, early physical therapy, gradual improvement over weeks. - Planned to obtain medical clearance from primary care provider. - Planned preoperative evaluation approximately 1 week prior to surgery     Follow-up: Return for After medical clearance for OR.  Subjective:  Chief Complaint  Patient presents with   Hip Pain    Bilat hips here to discuss surgery     Discussed the use of AI scribe software for clinical note transcription with the patient, who gave verbal consent to proceed.  History of Present Illness Francis Bennett  is a 59 year old male with bilateral hip osteoarthritis who presents for evaluation of worsening right hip pain.  He has had progressive bilateral hip pain for 3 to 4 years, with the right hip much worse. Pain is circumferential around the right hip without groin radiation. Morning pain and stiffness are most severe, causing difficulty straightening up and walking, and he occasionally feels he might fall due to pain. He has no prior hip injury. He struggles with hip flexion tasks like putting on socks and tying shoes because of limited mobility and does not use assistive devices.  He takes meloxicam  15 mg each morning, which controls pain for most of the day, but pain returns in the evening around 8 PM. He had intra-articular steroid injections in both hips about 2 years ago. He was scheduled for right total hip arthroplasty last year but did not proceed due to relocation. He has had prior orthopedic evaluation and hip imaging.  For the past 5 to 6 months he has had worsening morning numbness and tingling in his feet and paresthesias radiating down his leg when pain and stiffness are severe. He cannot fully straighten up during these episodes. He denies persistent numbness or tingling later in the day.  He has chronic low back pain and is concerned it may be related to his hip symptoms. He is unemployed and has applied for disability due to hip pain and functional limitation. He does not use alcohol or tobacco.    Review of Systems: No fevers or chills No numbness or tingling No chest pain No shortness of breath No bowel or bladder dysfunction No GI distress No headaches   Medical History:  No past medical history on file.  Past Surgical History:  Procedure Laterality Date   skull surgery      No family history on file. Social History[1]  Allergies[2]  Active Medications[3]  Objective: There were no vitals taken for this visit.  Physical Exam:    General: Alert and  oriented. and No acute distress. Gait: Right sided antalgic gait.  Physical Exam MUSCULOSKELETAL: Restricted IR and ER of B hips.  Negative straight leg raise.  NEUROLOGICAL: Sensation intact in the feet.   IMAGING: I personally reviewed images previously obtained in clinic  Advanced OA of bilateral hips, right worse than left.      New Medications:  No orders of the defined types were placed in this encounter.     Portions of this note were completed via Scientist, clinical (histocompatibility and immunogenetics).  Oneil DELENA Horde, MD  08/02/2024 12:15 AM      [1]  Social History Tobacco Use   Smoking status: Smoker, Current Status Unknown   Smokeless tobacco: Never  Vaping Use   Vaping status: Former  Substance Use Topics   Alcohol use: Yes    Comment: occasionally   Drug use: Not Currently  [2]  Allergies Allergen Reactions   Peanut-Containing Drug Products     Anaphylaxis   [3]  Current Meds  Medication Sig   meloxicam  (MOBIC ) 15 MG tablet Take 1 tablet (15 mg total) by mouth daily.

## 2024-07-31 NOTE — Patient Instructions (Signed)
 Preoperative Instructions  Your surgery will be at [facility], scheduled with Dr Oneil Horde   The hospital will contact you with a preoperative appointment to discuss Anesthesia. The phone number is [phone]   Please bring your medications with you for the appointment.  They will tell you the arrival time and medication instructions when you have your preoperative evaluation.  Do not wear nail polish the day of your surgery and if you take Phentermine you need to stop this medication ONE WEEK prior to your surgery.    If you take an blood thinning medication, we will need to stop this prior to surgery.  Typically, we stop this medicine at least 5 days prior to surgery.  We will need to confirm this with the doctor who prescribes this medication.  If you are taking medications or an injection for diabetes, or for weight management, this medicine will need to be stopped at least 7 days prior to surgery.     Surgery will be scheduled for TBD pending authorization by your insurance company.   Pain medicine policy:  Per [facility] clinic policy, our goal is ensure optimal postoperative pain control with a multimodal pain management strategy.   For all [facility] patients, our goal is to wean post-operative narcotic medications by 6 weeks post-operatively.   If this is not possible due to utilization of pain medication prior to surgery, your [facility] doctor will support your acute post-operative pain control for the first 6 weeks postoperatively, with a plan to transition you back to your primary pain team following that.   [facility] will work to ensure a therapist, occupational.

## 2024-08-02 ENCOUNTER — Encounter: Payer: Self-pay | Admitting: Orthopedic Surgery

## 2024-08-08 ENCOUNTER — Encounter: Payer: Self-pay | Admitting: Orthopedic Surgery

## 2024-08-08 MED ORDER — MELOXICAM 15 MG PO TABS: 15.0000 mg | ORAL_TABLET | Freq: Every day | ORAL | 0 refills | Status: AC

## 2024-08-09 ENCOUNTER — Telehealth: Payer: Self-pay

## 2024-08-09 NOTE — Telephone Encounter (Signed)
 Patient wasn't aware of it being done
